# Patient Record
Sex: Female | Born: 2000 | Race: Black or African American | Marital: Single | Smoking: Never smoker
Health system: Southern US, Community
[De-identification: ages and names within clinical notes are randomized; demographics above are authoritative.]

## PROBLEM LIST (undated history)

## (undated) ENCOUNTER — Ambulatory Visit: Source: Home / Self Care

## (undated) HISTORY — PX: NO PAST SURGERIES: SHX2092

---
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
## 2018-05-01 ENCOUNTER — Encounter (HOSPITAL_COMMUNITY): Payer: Self-pay | Admitting: Emergency Medicine

## 2018-05-01 ENCOUNTER — Ambulatory Visit (HOSPITAL_COMMUNITY)
Admission: EM | Admit: 2018-05-01 | Discharge: 2018-05-01 | Disposition: A | Discharge status: Home / Self Care | Payer: Managed Care, Other (non HMO) | Attending: Internal Medicine | Admitting: Internal Medicine

## 2018-05-01 DIAGNOSIS — G43119 Migraine with aura, intractable, without status migrainosus: Secondary | ICD-10-CM

## 2018-05-01 MED ORDER — SUMATRIPTAN SUCCINATE 6 MG/0.5ML ~~LOC~~ SOLN
SUBCUTANEOUS | Status: AC
  Filled 2018-05-01: qty 0.5

## 2018-05-01 MED ORDER — SUMATRIPTAN SUCCINATE 6 MG/0.5ML ~~LOC~~ SOLN
6.0000 mg | Freq: Once | SUBCUTANEOUS | Status: AC
  Administered 2018-05-01: 6 mg via SUBCUTANEOUS

## 2018-05-01 MED ORDER — KETOROLAC TROMETHAMINE 30 MG/ML IJ SOLN
INTRAMUSCULAR | Status: AC
  Filled 2018-05-01: qty 1

## 2018-05-01 MED ORDER — DEXAMETHASONE SODIUM PHOSPHATE 10 MG/ML IJ SOLN
10.0000 mg | Freq: Once | INTRAMUSCULAR | Status: AC
  Administered 2018-05-01: 10 mg via INTRAMUSCULAR

## 2018-05-01 MED ORDER — KETOROLAC TROMETHAMINE 30 MG/ML IJ SOLN
30.0000 mg | Freq: Once | INTRAMUSCULAR | Status: AC
  Administered 2018-05-01: 30 mg via INTRAVENOUS

## 2018-05-01 MED ORDER — DEXAMETHASONE SODIUM PHOSPHATE 10 MG/ML IJ SOLN
INTRAMUSCULAR | Status: AC
  Filled 2018-05-01: qty 1

## 2018-05-01 MED ORDER — SUMATRIPTAN SUCCINATE 25 MG PO TABS: ORAL_TABLET | ORAL | 0 refills | Status: DC

## 2018-05-01 NOTE — ED Provider Notes (Signed)
[facility]    CSN: [id number] Arrival date & time: 05/01/18  1245     History   Chief Complaint Chief Complaint  Patient presents with  . Headache    HPI Corrine Tillis is a 18 y.o. female no past medical history comes to urgent care with complaints of generalized headache of 2 weeks duration.  Headache is severe 10 out of 10.  No relieving factors.  Headache is worsened by bright light.  She complains of noncolored floaters in her visual field.  No nausea or vomiting.  Patient has not been sleeping well.  No numbness or tingling.Marland Kitchen   HPI  History reviewed. No pertinent past medical history.  There are no active problems to display for this patient.   History reviewed. No pertinent surgical history.  OB History   No obstetric history on file.      Home Medications    Prior to Admission medications   Medication Sig Start Date End Date Taking? Authorizing Provider  cyclobenzaprine (FLEXERIL) 10 MG tablet Take 1 tablet (10 mg total) by mouth 2 (two) times daily as needed for muscle spasms. 11/10/15   Shaune Pollack, MD  ibuprofen (ADVIL,MOTRIN) 100 MG tablet Take 100 mg by mouth every 6 (six) hours as needed for fever.    [provider]  naproxen (NAPROSYN) 500 MG tablet Take 1 tablet (500 mg total) by mouth 2 (two) times daily as needed for headache. 11/10/15   Shaune Pollack, MD  SUMAtriptan (IMITREX) 25 MG tablet Please take 1 tablet with headache. May repeat in 2 hours if headache persists or recurs. 05/01/18   LampteyBritta Mccreedy, MD    Family History Family History  Problem Relation Age of Onset  . Healthy Mother   . Healthy Father     Social History Social History   Tobacco Use  . Smoking status: Never Smoker  . Smokeless tobacco: Never Used  Substance Use Topics  . Alcohol use: No  . Drug use: No     Allergies   Other   Review of Systems Review of Systems  Constitutional: Negative for activity change and appetite change.    HENT: Negative for congestion, ear discharge, ear pain, postnasal drip, rhinorrhea and tinnitus.   Eyes: Negative for discharge and itching.  Respiratory: Negative for cough, chest tightness and wheezing.   Gastrointestinal: Negative for abdominal distention and abdominal pain.  Endocrine: Negative for cold intolerance.  Genitourinary: Negative for dysuria, frequency and urgency.  Musculoskeletal: Negative for myalgias, neck pain and neck stiffness.  Skin: Negative for rash and wound.  Neurological: Positive for dizziness and light-headedness. Negative for weakness and numbness.  Psychiatric/Behavioral: Negative for agitation and confusion.     Physical Exam Triage Vital Signs ED Triage Vitals  Enc Vitals Group     BP 05/01/18 1318 129/60     Pulse Rate 05/01/18 1318 91     Resp 05/01/18 1318 18     Temp 05/01/18 1318 (!) 97.5 F (36.4 C)     Temp Source 05/01/18 1318 Temporal     SpO2 05/01/18 1318 100 %     Weight --      Height --      Head Circumference --      Peak Flow --      Pain Score 05/01/18 1319 7     Pain Loc --      Pain Edu? --      Excl. in GC? --    No data  found.  Updated Vital Signs BP 129/60 (BP Location: Right Arm)   Pulse 91   Temp (!) 97.5 F (36.4 C) (Temporal)   Resp 18   SpO2 100%   Visual Acuity Right Eye Distance:   Left Eye Distance:   Bilateral Distance:    Right Eye Near:   Left Eye Near:    Bilateral Near:     Physical Exam Constitutional:      General: She is in acute distress.     Appearance: She is well-developed.  HENT:     Mouth/Throat:     Mouth: Mucous membranes are moist.  Eyes:     General: No visual field deficit.    Pupils: Pupils are equal, round, and reactive to light.  Neck:     Musculoskeletal: Normal range of motion. No neck rigidity.  Cardiovascular:     Rate and Rhythm: Normal rate and regular rhythm.     Heart sounds: Normal heart sounds.  Pulmonary:     Effort: Pulmonary effort is normal.      Breath sounds: Normal breath sounds.  Abdominal:     General: Bowel sounds are normal.     Palpations: Abdomen is soft.  Musculoskeletal: Normal range of motion.  Lymphadenopathy:     Cervical: No cervical adenopathy.  Skin:    General: Skin is warm.     Capillary Refill: Capillary refill takes less than 2 seconds.     Findings: No erythema or rash.  Neurological:     Mental Status: She is alert.     GCS: GCS eye subscore is 4. GCS verbal subscore is 5. GCS motor subscore is 6.     Cranial Nerves: No cranial nerve deficit, dysarthria or facial asymmetry.     Sensory: No sensory deficit.     Motor: No weakness.     Gait: Gait normal.     Deep Tendon Reflexes: Reflexes normal.  Psychiatric:        Mood and Affect: Mood normal.        Behavior: Behavior normal.      UC Treatments / Results  Labs (all labs ordered are listed, but only abnormal results are displayed) Labs Reviewed - No data to display  EKG None  Radiology No results found.  Procedures Procedures (including critical care time)  Medications Ordered in UC Medications  dexamethasone (DECADRON) injection 10 mg (10 mg Intramuscular Given 05/01/18 1436)  SUMAtriptan (IMITREX) injection 6 mg (6 mg Subcutaneous Given 05/01/18 1436)  ketorolac (TORADOL) 30 MG/ML injection 30 mg (30 mg Intravenous Given 05/01/18 1437)    Initial Impression / Assessment and Plan / UC Course  I have reviewed the triage vital signs and the nursing notes.  Pertinent labs & imaging results that were available during my care of the patient were reviewed by me and considered in my medical decision making (see chart for details).     1.  Intractable migraine with aura: Toradol 30 mg IM Imitrex 6 mg subcu Decadron 10 mg IM Patient is prescribed Imitrex 25 mg orally for headache. Final Clinical Impressions(s) / UC Diagnoses   Final diagnoses:  Intractable migraine with aura without status migrainosus   Discharge Instructions   None     ED Prescriptions    Medication Sig Dispense Auth. Provider   SUMAtriptan (IMITREX) 25 MG tablet Please take 1 tablet with headache. May repeat in 2 hours if headache persists or recurs. 10 tablet Danyela Posas, Britta Mccreedy, MD     Controlled Substance  Prescriptions [place] Controlled Substance Registry consulted? No   Merrilee Jansky, MD 05/01/18 1455

## 2018-05-01 NOTE — ED Triage Notes (Signed)
Pt sts HA x 2 weeks and had some dizziness today

## 2019-07-11 ENCOUNTER — Other Ambulatory Visit (HOSPITAL_COMMUNITY)
Admission: RE | Admit: 2019-07-11 | Discharge: 2019-07-11 | Disposition: A | Discharge status: Home / Self Care | Payer: Managed Care, Other (non HMO) | Source: Ambulatory Visit | Attending: Internal Medicine | Admitting: Internal Medicine

## 2019-07-11 ENCOUNTER — Other Ambulatory Visit: Payer: Self-pay | Admitting: Internal Medicine

## 2019-07-11 DIAGNOSIS — Z124 Encounter for screening for malignant neoplasm of cervix: Secondary | ICD-10-CM | POA: Diagnosis not present

## 2019-07-12 LAB — CYTOLOGY - PAP
Adequacy: ABSENT
Comment: NEGATIVE
Diagnosis: NEGATIVE
Trichomonas: NEGATIVE

## 2019-07-22 ENCOUNTER — Other Ambulatory Visit: Payer: Self-pay

## 2019-07-22 ENCOUNTER — Ambulatory Visit (HOSPITAL_COMMUNITY)
Admission: EM | Admit: 2019-07-22 | Discharge: 2019-07-22 | Disposition: A | Discharge status: Home / Self Care | Payer: Managed Care, Other (non HMO) | Attending: Family Medicine | Admitting: Family Medicine

## 2019-07-22 ENCOUNTER — Encounter (HOSPITAL_COMMUNITY): Payer: Self-pay

## 2019-07-22 DIAGNOSIS — R1084 Generalized abdominal pain: Secondary | ICD-10-CM | POA: Diagnosis present

## 2019-07-22 DIAGNOSIS — R112 Nausea with vomiting, unspecified: Secondary | ICD-10-CM | POA: Insufficient documentation

## 2019-07-22 DIAGNOSIS — R197 Diarrhea, unspecified: Secondary | ICD-10-CM | POA: Diagnosis not present

## 2019-07-22 DIAGNOSIS — N3000 Acute cystitis without hematuria: Secondary | ICD-10-CM | POA: Diagnosis present

## 2019-07-22 LAB — POCT URINALYSIS DIP (DEVICE)
Bilirubin Urine: NEGATIVE
Glucose, UA: NEGATIVE mg/dL
Hgb urine dipstick: NEGATIVE
Ketones, ur: NEGATIVE mg/dL
Leukocytes,Ua: NEGATIVE
Nitrite: NEGATIVE
Protein, ur: NEGATIVE mg/dL
Specific Gravity, Urine: 1.02 (ref 1.005–1.030)
Urobilinogen, UA: 0.2 mg/dL (ref 0.0–1.0)
pH: 8.5 — ABNORMAL HIGH (ref 5.0–8.0)

## 2019-07-22 LAB — POC URINE PREG, ED: Preg Test, Ur: NEGATIVE

## 2019-07-22 MED ORDER — ONDANSETRON HCL 4 MG PO TABS: 4.0000 mg | ORAL_TABLET | Freq: Four times a day (QID) | ORAL | 0 refills | Status: DC

## 2019-07-22 MED ORDER — NITROFURANTOIN MONOHYD MACRO 100 MG PO CAPS: 100.0000 mg | ORAL_CAPSULE | Freq: Two times a day (BID) | ORAL | 0 refills | Status: DC

## 2019-07-22 NOTE — ED Triage Notes (Signed)
Patient reports she went out last night with her family. Woke up this morning at 530am with abdominal pain, n/v/d. Reports she thinks it could be food poisoning or the alcohol from last night. Denies URI symptoms.

## 2019-07-22 NOTE — ED Provider Notes (Signed)
[facility]   [id number] 07/22/19 Arrival Time: 1147  CC: ABDOMINAL PAIN  SUBJECTIVE:  Sylvia Daniel is a 19 y.o. female who presents with complaint of abdominal discomfort that began abruptly last night. Reports that she had dinner at a restaurant last night and that she had 3 alcoholic drinks last night. Describes as intermittent and cramping in character. Reports that she also currently has BV and has not taken the medication for it yet. Has not tried OTC medications. Denies alleviating or aggravating factors. Denies similar symptoms in the past.  Last BM yesterday.  Denies fever, chills, appetite changes, weight changes, chest pain, SOB, diarrhea, constipation, hematochezia, melena, dysuria, difficulty urinating, increased frequency or urgency, flank pain, loss of bowel or bladder function, vaginal discharge, vaginal odor, vaginal bleeding, dyspareunia, pelvic pain.     Patient's last menstrual period was 07/14/2019 (exact date).  ROS: As per HPI.  All other pertinent ROS negative.     History reviewed. No pertinent past medical history. History reviewed. No pertinent surgical history. Allergies  Allergen Reactions  . Other     A particular type of soap at work   No current facility-administered medications on file prior to encounter.   Current Outpatient Medications on File Prior to Encounter  Medication Sig Dispense Refill  . cyclobenzaprine (FLEXERIL) 10 MG tablet Take 1 tablet (10 mg total) by mouth 2 (two) times daily as needed for muscle spasms. 20 tablet 0  . ibuprofen (ADVIL,MOTRIN) 100 MG tablet Take 100 mg by mouth every 6 (six) hours as needed for fever.    . naproxen (NAPROSYN) 500 MG tablet Take 1 tablet (500 mg total) by mouth 2 (two) times daily as needed for headache. 30 tablet 0  . SUMAtriptan (IMITREX) 25 MG tablet Please take 1 tablet with headache. May repeat in 2 hours if headache persists or recurs. 10 tablet 0   Social History   Socioeconomic  History  . Marital status: Single    Spouse name: Not on file  . Number of children: Not on file  . Years of education: Not on file  . Highest education level: Not on file  Occupational History  . Not on file  Tobacco Use  . Smoking status: Never Smoker  . Smokeless tobacco: Never Used  Substance and Sexual Activity  . Alcohol use: No  . Drug use: No  . Sexual activity: Yes    Birth control/protection: None  Other Topics Concern  . Not on file  Social History Narrative  . Not on file   Social Determinants of Health   Financial Resource Strain:   . Difficulty of Paying Living Expenses:   Food Insecurity:   . Worried About Programme researcher, broadcasting/film/video in the Last Year:   . Barista in the Last Year:   Transportation Needs:   . Freight forwarder (Medical):   Marland Kitchen Lack of Transportation (Non-Medical):   Physical Activity:   . Days of Exercise per Week:   . Minutes of Exercise per Session:   Stress:   . Feeling of Stress :   Social Connections:   . Frequency of Communication with Friends and Family:   . Frequency of Social Gatherings with Friends and Family:   . Attends Religious Services:   . Active Member of Clubs or Organizations:   . Attends Banker Meetings:   Marland Kitchen Marital Status:   Intimate Partner Violence:   . Fear of Current or Ex-Partner:   . Emotionally  Abused:   Marland Kitchen Physically Abused:   . Sexually Abused:    Family History  Problem Relation Age of Onset  . Healthy Mother   . Healthy Father      OBJECTIVE:  Vitals:   07/22/19 1256  BP: 137/65  Pulse: 78  Resp: 16  Temp: 98.3 F (36.8 C)  TempSrc: Oral  SpO2: 99%    General appearance: Alert; NAD HEENT: NCAT.  Oropharynx clear.  Lungs: clear to auscultation bilaterally without adventitious breath sounds Heart: regular rate and rhythm.  Radial pulses 2+ symmetrical bilaterally Abdomen: soft, non-distended; normal active bowel sounds; non-tender to light and deep palpation; nontender  at McBurney's point; negative Murphy's sign; negative rebound; no guarding Back: no CVA tenderness Extremities: no edema; symmetrical with no gross deformities Skin: warm and dry Neurologic: normal gait Psychological: alert and cooperative; normal mood and affect  LABS: Results for orders placed or performed during the hospital encounter of 07/22/19 (from the past 24 hour(s))  POCT urinalysis dip (device)     Status: Abnormal   Collection Time: 07/22/19  1:07 PM  Result Value Ref Range   Glucose, UA NEGATIVE NEGATIVE mg/dL   Bilirubin Urine NEGATIVE NEGATIVE   Ketones, ur NEGATIVE NEGATIVE mg/dL   Specific Gravity, Urine 1.020 1.005 - 1.030   Hgb urine dipstick NEGATIVE NEGATIVE   pH 8.5 (H) 5.0 - 8.0   Protein, ur NEGATIVE NEGATIVE mg/dL   Urobilinogen, UA 0.2 0.0 - 1.0 mg/dL   Nitrite NEGATIVE NEGATIVE   Leukocytes,Ua NEGATIVE NEGATIVE  POC urine pregnancy     Status: None   Collection Time: 07/22/19  1:15 PM  Result Value Ref Range   Preg Test, Ur NEGATIVE NEGATIVE    DIAGNOSTIC STUDIES: No results found.   ASSESSMENT & PLAN:  1. Acute cystitis without hematuria   2. Generalized abdominal pain   3. Nausea vomiting and diarrhea     Meds ordered this encounter  Medications  . nitrofurantoin, macrocrystal-monohydrate, (MACROBID) 100 MG capsule    Sig: Take 1 capsule (100 mg total) by mouth 2 (two) times daily.    Dispense:  10 capsule    Refill:  0    Order Specific Question:   Supervising Provider    Answer:   Merrilee Jansky [id number]  . ondansetron (ZOFRAN) 4 MG tablet    Sig: Take 1 tablet (4 mg total) by mouth every 6 (six) hours.    Dispense:  12 tablet    Refill:  0    Order Specific Question:   Supervising Provider    Answer:   Merrilee Jansky [[id number]]    Macrobid prescribed UC culture ordered Get rest and drink fluids Zofran prescribed.  Take as directed.    DIET Instructions:  30 minutes after taking nausea medicine, begin with sips of clear  liquids. If able to hold down 2 - 4 ounces for 30 minutes, begin drinking more. Increase your fluid intake to replace losses. Clear liquids only for 24 hours (water, tea, sport drinks, clear flat ginger ale or cola and juices, broth, jello, popsicles, ect). Advance to bland foods, applesauce, rice, baked or boiled chicken, ect. Avoid milk, greasy foods and anything that doesn't agree with you.  If you experience new or worsening symptoms return or go to ER such as fever, chills, nausea, vomiting, diarrhea, bloody or dark tarry stools, constipation, urinary symptoms, worsening abdominal discomfort, symptoms that do not improve with medications, inability to keep fluids down.  Reviewed expectations re: course of  current medical issues. Questions answered. Outlined signs and symptoms indicating need for more acute intervention. Patient verbalized understanding. After Visit Summary given.    Faustino Congress, NP 07/22/19 1347

## 2019-07-22 NOTE — Discharge Instructions (Addendum)
You may have a urinary tract infection. We are going to culture your urine and will call you as soon as we have the results.   I have sent in Macrobid for you to take  I have also sent in Zofran for you to take for nausea as needed.  Drink plenty of water, 8-10 glasses per day.   You may take AZO over the counter for painful urination.  Follow up with your primary care provider as needed.   Go to the Emergency Department if you experience severe pain, shortness of breath, high fever, or other concerns.

## 2019-07-23 LAB — URINE CULTURE: Culture: <10000 — AB

## 2020-01-01 ENCOUNTER — Other Ambulatory Visit: Payer: Self-pay

## 2020-01-01 ENCOUNTER — Encounter: Payer: Self-pay | Admitting: Registered"

## 2020-01-01 ENCOUNTER — Encounter: Payer: Managed Care, Other (non HMO) | Attending: Internal Medicine | Admitting: Registered"

## 2020-01-01 DIAGNOSIS — E669 Obesity, unspecified: Secondary | ICD-10-CM | POA: Diagnosis not present

## 2020-01-01 NOTE — Progress Notes (Signed)
  Medical Nutrition Therapy:  Appt start time: 8:35 end time:  9:30.   Assessment:  Primary concerns today: Pt states her PCP wanted her to come for guidance to lose weight. States she is on the verge of having diabetes and wants her to lose weight to prevent being diagnosed with diabetes. Pt states she is unaware of glucose labs/A1c. States she was told her BMI is too high for her age. Pt states her weight fluctuates. States she wants to lose weight because she is not happy. States she doesn't feel confident in her body. States she doesn't have energy. Feels bloated all the time. States she doesn't eat 3 meals a day; eats 1-2 times/day. States she will eat all day long when having her menstrual cycle.    Pt states she feels stressed because she is unable to lose weight. States she wants to lose like 50 lbs which could help with self-esteem. Reports she has been this size all of her life. States family and peers have negatively commented on her being fat.   States she will start a diet and then give up. States she eats a lot of greasy food.   Works from home for [employer] center. States she doesn't get enough rest. Sleeps about 5-6 hrs/night. States sometimes its more and sometimes less.   Pt expectations: how to lose weight   Preferred Learning Style:   No preference indicated   Learning Readiness:   Not ready  Contemplating  Ready  Change in progress   MEDICATIONS: See list   DIETARY INTAKE:  Usual eating pattern includes 1-2 meals and 0-1 snacks per day.  Everyday foods include fried foods, corn, broccoli/cheese, granola bar, cereal.  Avoided foods include none stated.    24-hr recall:  B ( AM): cereal or Gatorade/juice Snk ( AM):   L ( PM):  Snk ( PM):  D (5 PM): 15 pizza rolls or fried chicken + broccoli/cheese + corn Snk ( PM): granola bar Beverages: Gatorade/juice, water  Usual physical activity: none reported  Estimated energy needs: 2000 calories 225 g  carbohydrates 150 g protein 56 g fat  Progress Towards Goal(s):  In progress.   Nutritional Diagnosis:  NB-1.1 Food and nutrition-related knowledge deficit As related to lack of prior nutrition related information.  As evidenced by pt verbalizes incomplete knowledge.    Intervention: Pt was educated and counseled on the benefits of eating throughout the day, metabolism, eating to fuel the body, diet culture, risks for diabetes, health at every size, and importance of physical activity. Pt was in agreement with goal listed. Goals: - Aim to eat every 3-5 hours.  - Aim to balance meals with protein, starch/grain, fruit, vegetables, and dairy.  - Increase physical activity with walking 2-3 days/week for 20-30 minutes.   Teaching Method Utilized:  Visual Auditory Hands on  Handouts given during visit include:  none  Barriers to learning/adherence to lifestyle change: none identified  Demonstrated degree of understanding via:  Teach Back   Monitoring/Evaluation:  Dietary intake, exercise, and body weight in 1 month(s).

## 2020-01-01 NOTE — Patient Instructions (Addendum)
-   Aim to eat every 3-5 hours.   - Aim to balance meals with protein, starch/grain, fruit, vegetables, and dairy.   - Increase physical activity with walking 2-3 days/week for 20-30 minutes.

## 2020-01-29 ENCOUNTER — Other Ambulatory Visit: Payer: Self-pay

## 2020-01-29 ENCOUNTER — Encounter: Payer: Self-pay | Admitting: Registered"

## 2020-01-29 ENCOUNTER — Encounter: Payer: Managed Care, Other (non HMO) | Attending: Internal Medicine | Admitting: Registered"

## 2020-01-29 DIAGNOSIS — E669 Obesity, unspecified: Secondary | ICD-10-CM | POA: Diagnosis not present

## 2020-01-29 NOTE — Progress Notes (Signed)
  Medical Nutrition Therapy:  Appt start time: 9:16 end time:  9:33   Assessment:  Primary concerns today: Pt states her PCP wanted her to come for guidance to lose weight.   Pt arrives with younger cousin. States younger cousin has moved in with her for a few months and plans to start going to the gym and walking with her. States she has started drinking more 2% milk, eating more wheat bread, and reduced intake of fried foods. States she has only had fried food once since previous visit; yesterday for her "cheat" day.Reports eating more often during the day to help nourish body. States she has a gym in her apartment complex and may go there as well for movement.   States she had lab work done on 11/9 but has not received results yet. Plans to contact PCP office for results today.   Previous visit: Works from home for [employer] center. Works Sunday, Monday, Thursday, and Friday. States she doesn't get enough rest. Sleeps about 5-6 hrs/night. States sometimes its more and sometimes less.   Pt expectations: how to lose weight   Preferred Learning Style:   No preference indicated   Learning Readiness:   Not ready  Contemplating  Ready  Change in progress   MEDICATIONS: See list   DIETARY INTAKE:  Usual eating pattern includes 2-3 meals and 0-1 snacks per day.  Everyday foods include fried foods, corn, broccoli/cheese, granola bar, cereal.  Avoided foods include none stated.    24-hr recall:  B ( AM): eggs + sausage + OJ Snk ( AM):   L ( PM): beefaroni + water Snk ( PM): banana pudding D (5 PM): fried chicken + greens + corn on cob + OJ Snk ( PM): banana Beverages: water (2*16 oz; 16 oz; 32 oz), OJ (2*12 oz; 24 oz); 56 oz total   Usual physical activity: none reported  Estimated energy needs: 2000 calories 225 g carbohydrates 150 g protein 56 g fat  Progress Towards Goal(s):  In progress.   Nutritional Diagnosis:  NB-1.1 Food and nutrition-related knowledge  deficit As related to lack of prior nutrition related information.  As evidenced by pt verbalizes incomplete knowledge.    Intervention: Pt was encouraged with changes made thus far. Discussed role of physical activity and overall health benefits. Discussed increasing water intake. Pt was in agreement with goal listed. Goals: - Keep up the great work eating 3 meals a day! - Increase water intake to 3 bottles a day.  - Have with lunch, dinner, and another time during the  day.   - Goal is at least 48 oz day.  - Increase physical activity with walking 2-3 days/week for 20-30 minutes.   Teaching Method Utilized:  Visual Auditory Hands on  Handouts given during visit include:  none  Barriers to learning/adherence to lifestyle change: none identified  Demonstrated degree of understanding via:  Teach Back   Monitoring/Evaluation:  Dietary intake, exercise, and body weight in 1 month(s).

## 2020-01-29 NOTE — Patient Instructions (Signed)
-   Keep up the great work eating 3 meals a day!  - Increase water intake to 3 bottles a day.  - Have with lunch, dinner, and another time during the day.   - Goal is at least 48 oz day.   - Increase physical activity with walking 2-3 days/week for 20-30 minutes.

## 2020-02-27 ENCOUNTER — Ambulatory Visit: Payer: Managed Care, Other (non HMO) | Admitting: Registered"

## 2020-03-12 ENCOUNTER — Ambulatory Visit: Payer: Managed Care, Other (non HMO) | Admitting: Registered"

## 2020-04-01 ENCOUNTER — Ambulatory Visit: Payer: Managed Care, Other (non HMO) | Admitting: Registered"

## 2020-07-10 ENCOUNTER — Other Ambulatory Visit: Payer: Self-pay

## 2020-07-10 ENCOUNTER — Encounter: Payer: Self-pay | Admitting: General Practice

## 2020-07-10 ENCOUNTER — Ambulatory Visit (INDEPENDENT_AMBULATORY_CARE_PROVIDER_SITE_OTHER): Payer: Managed Care, Other (non HMO) | Admitting: Obstetrics & Gynecology

## 2020-07-10 ENCOUNTER — Encounter: Payer: Self-pay | Admitting: Obstetrics & Gynecology

## 2020-07-10 VITALS — BP 127/68 | HR 98 | Ht 65.0 in | Wt 253.0 lb

## 2020-07-10 DIAGNOSIS — Z01419 Encounter for gynecological examination (general) (routine) without abnormal findings: Secondary | ICD-10-CM

## 2020-07-10 DIAGNOSIS — Z113 Encounter for screening for infections with a predominantly sexual mode of transmission: Secondary | ICD-10-CM | POA: Diagnosis not present

## 2020-07-10 DIAGNOSIS — N898 Other specified noninflammatory disorders of vagina: Secondary | ICD-10-CM

## 2020-07-10 NOTE — Progress Notes (Signed)
Subjective:     Sylvia Daniel is a 20 y.o. female here for a routine exam.  Current complaints: vaginal discharge with odor. Pt was referred here by her mother who is a pt. She reports monthly cycles that last 5 days. She is sexually active with no contraception. She does not want contraception. She reports that she is not actively trying to conceive but, would be ok if she gets pregnant. She works on [employer]. 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       Gynecologic History Patient's last menstrual period was 06/29/2020 (exact date). Contraception: none Last Pap: 07/11/2019. Results were: normal Last mammogram: n/a.  Obstetric History OB History  Gravida Para Term Preterm AB Living  0 0 0 0 0 0  SAB IAB Ectopic Multiple Live Births  0 0 0 0 0   The following portions of the patient's history were reviewed and updated as appropriate: allergies, current medications, past family history, past medical history, past social history, past surgical history and problem list.  Review of Systems Pertinent items are noted in HPI.    Objective:  BP 127/68 (BP Location: Right Arm, Patient Position: Sitting, Cuff Size: Large)   Pulse 98   Ht 5\' 5"  (1.651 m)   Wt 253 lb (114.8 kg)   LMP 06/29/2020 (Exact Date)   BMI 42.10 kg/m  General Appearance:    Alert, cooperative, no distress, appears stated age  Head:    Normocephalic, without obvious abnormality, atraumatic  Eyes:    conjunctiva/corneas clear, EOM's intact, both eyes  Ears:    Normal external ear canals, both ears  Nose:   Nares normal, septum midline, mucosa normal, no drainage    or sinus tenderness  Throat:   Lips, mucosa, and tongue normal; teeth and gums normal  Neck:   Supple, symmetrical, trachea midline, no adenopathy;    thyroid:  no enlargement/tenderness/nodules  Back:     Symmetric, no curvature, ROM normal, no CVA tenderness  Lungs:     respirations unlabored  Chest Wall:    No tenderness or deformity    Heart:    Regular rate and rhythm  Breast Exam:    No tenderness, masses, or nipple abnormality  Abdomen:     Soft, non-tender, bowel sounds active all four quadrants,    no masses, no organomegaly. Has umbilical ring.   Genitalia:    Normal female without lesion, discharge or tenderness     Extremities:   Extremities normal, atraumatic, no cyanosis or edema  Pulses:   2+ and symmetric all extremities  Skin:   Skin color, texture, turgor normal, no rashes or lesions; multiple tatoos on ext      Assessment:    Healthy female exam.   Vaginal odor STI screen   Plan:   Sylvia Daniel was seen today for gynecologic exam.  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   Sylvia Daniel L. Harraway-Smith, M.D., 08/29/2020

## 2020-07-10 NOTE — Progress Notes (Signed)
Pt presents today as new patient for yearly exam. LMP: 06/29/20. Pt is currently not on anything for [facility] and does not wish to go on anything at this time. Last pap was 07/11/19 and was normal. Pt states she is having vaginal odor that she is concerned about. Pt has not other complaints at this time.

## 2020-07-11 LAB — CERVICOVAGINAL ANCILLARY ONLY
Bacterial Vaginitis (gardnerella): NEGATIVE
Candida Glabrata: NEGATIVE
Candida Vaginitis: NEGATIVE
Chlamydia: NEGATIVE
Comment: NEGATIVE
Comment: NEGATIVE
Comment: NEGATIVE
Comment: NEGATIVE
Comment: NEGATIVE
Comment: NORMAL
Neisseria Gonorrhea: NEGATIVE
Trichomonas: NEGATIVE

## 2021-06-19 ENCOUNTER — Other Ambulatory Visit: Payer: Self-pay

## 2021-06-19 ENCOUNTER — Encounter (HOSPITAL_COMMUNITY): Payer: Self-pay | Admitting: Emergency Medicine

## 2021-06-19 ENCOUNTER — Ambulatory Visit (HOSPITAL_COMMUNITY)
Admission: EM | Admit: 2021-06-19 | Discharge: 2021-06-19 | Disposition: A | Discharge status: Home / Self Care | Payer: Managed Care, Other (non HMO) | Attending: Family Medicine | Admitting: Family Medicine

## 2021-06-19 DIAGNOSIS — M62830 Muscle spasm of back: Secondary | ICD-10-CM | POA: Diagnosis not present

## 2021-06-19 MED ORDER — TIZANIDINE HCL 4 MG PO TABS: 4.0000 mg | ORAL_TABLET | Freq: Four times a day (QID) | ORAL | 0 refills | Status: DC | PRN

## 2021-06-19 NOTE — Discharge Instructions (Signed)
You were seen today for back pain due to muscle spasms.  ?I have sent out a muscle relaxer for you to take.  This could make you tired/sleepy so please take when home and not driving.   ?You should also take motrin/ibuprofen 600mg  every 6hrs with food to avoid upset stomach.  ?A heating pad would also be helpful.  ?Please return if not improving or worsening.  ?

## 2021-06-19 NOTE — ED Provider Notes (Signed)
?Emma ? ? ? ?CSN: [id number] ?Arrival date & time: 06/19/21  1303 ? ? ?  ? ?History   ?Chief Complaint ?Chief Complaint  ?Patient presents with  ? Back Spasm  ? ? ?HPI ?Sylvia Daniel is a 21 y.o. female.  ? ?Patient is here for back spasms that started yesterday.  At the low mid back and on the left.  ?She does heavy lifting at work.  ?She did try motrin without much help.  ?Her legs are sore, but no pain/numbness or weakness into the leg.  ? ?History reviewed. No pertinent past medical history. ? ?There are no problems to display for this patient. ? ? ?Past Surgical History:  ?Procedure Laterality Date  ? NO PAST SURGERIES    ? ? ?OB History   ? ? Gravida  ?0  ? Para  ?0  ? Term  ?0  ? Preterm  ?0  ? AB  ?0  ? Living  ?0  ?  ? ? SAB  ?0  ? IAB  ?0  ? Ectopic  ?0  ? Multiple  ?0  ? Live Births  ?0  ?   ?  ?  ? ? ? ?Home Medications   ? ?Prior to Admission medications   ?Not on File  ? ? ?Family History ?Family History  ?Problem Relation Age of Onset  ? Healthy Mother   ? Healthy Father   ? Diabetes Other   ? Hypertension Other   ? ? ?Social History ?Social History  ? ?Tobacco Use  ? Smoking status: Never  ? Smokeless tobacco: Never  ?Substance Use Topics  ? Alcohol use: No  ? Drug use: No  ? ? ? ?Allergies   ?Other ? ? ?Review of Systems ?Review of Systems  ?Constitutional: Negative.   ?HENT: Negative.    ?Respiratory: Negative.    ?Cardiovascular: Negative.   ?Gastrointestinal: Negative.   ?Genitourinary: Negative.   ?Musculoskeletal:  Positive for back pain.  ?Skin: Negative.   ? ? ?Physical Exam ?Triage Vital Signs ?ED Triage Vitals  ?Enc Vitals Group  ?   BP 06/19/21 1425 112/75  ?   Pulse Rate 06/19/21 1425 60  ?   Resp 06/19/21 1425 16  ?   Temp 06/19/21 1425 98.2 ?F (36.8 ?C)  ?   Temp Source 06/19/21 1425 Oral  ?   SpO2 06/19/21 1425 100 %  ?   Weight 06/19/21 1424 253 lb 1.4 oz (114.8 kg)  ?   Height 06/19/21 1424 5\' 5"  (1.651 m)  ?   Head Circumference --   ?   Peak Flow --   ?   Pain  Score 06/19/21 1423 10  ?   Pain Loc --   ?   Pain Edu? --   ?   Excl. in [place]? --   ? ?No data found. ? ?Updated Vital Signs ?BP 112/75 (BP Location: Left Arm)   Pulse 60   Temp 98.2 ?F (36.8 ?C) (Oral)   Resp 16   Ht 5\' 5"  (1.651 m)   Wt 114.8 kg   SpO2 100%   BMI 42.12 kg/m?  ? ?Visual Acuity ?Right Eye Distance:   ?Left Eye Distance:   ?Bilateral Distance:   ? ?Right Eye Near:   ?Left Eye Near:    ?Bilateral Near:    ? ?Physical Exam ?Constitutional:   ?   Appearance: Normal appearance.  ?Musculoskeletal:  ?   Comments: TTP at the lumbar spine;  TTP to  the left lower paraspinals;  ?Full ROM at the LE;  pain with resisted left quad testing  ?Skin: ?   General: Skin is warm.  ?Neurological:  ?   General: No focal deficit present.  ?   Mental Status: She is alert.  ?Psychiatric:     ?   Mood and Affect: Mood normal.  ? ? ? ?UC Treatments / Results  ?Labs ?(all labs ordered are listed, but only abnormal results are displayed) ?Labs Reviewed - No data to display ? ?EKG ? ? ?Radiology ?No results found. ? ?Procedures ?Procedures (including critical care time) ? ?Medications Ordered in UC ?Medications - No data to display ? ?Initial Impression / Assessment and Plan / UC Course  ?I have reviewed the triage vital signs and the nursing notes. ? ?Pertinent labs & imaging results that were available during my care of the patient were reviewed by me and considered in my medical decision making (see chart for details). ? ? ?Final Clinical Impressions(s) / UC Diagnoses  ? ?Final diagnoses:  ?Back spasm  ? ? ? ?Discharge Instructions   ? ?  ?You were seen today for back pain due to muscle spasms.  ?I have sent out a muscle relaxer for you to take.  This could make you tired/sleepy so please take when home and not driving.   ?You should also take motrin/ibuprofen 600mg  every 6hrs with food to avoid upset stomach.  ?A heating pad would also be helpful.  ?Please return if not improving or worsening.  ? ? ? ?ED Prescriptions    ? ? Medication Sig Dispense Auth. Provider  ? tiZANidine (ZANAFLEX) 4 MG tablet Take 1 tablet (4 mg total) by mouth every 6 (six) hours as needed for muscle spasms. 30 tablet Rondel Oh, MD  ? ?  ? ?PDMP not reviewed this encounter. ?  ?Rondel Oh, MD ?06/19/21 1440 ? ?

## 2021-06-19 NOTE — ED Triage Notes (Signed)
Pt reports back spasms since yesterday.  ?

## 2021-08-11 ENCOUNTER — Ambulatory Visit (INDEPENDENT_AMBULATORY_CARE_PROVIDER_SITE_OTHER): Payer: Managed Care, Other (non HMO)

## 2021-08-11 ENCOUNTER — Ambulatory Visit
Admission: EM | Admit: 2021-08-11 | Discharge: 2021-08-11 | Disposition: A | Discharge status: Home / Self Care | Payer: Managed Care, Other (non HMO) | Attending: Physician Assistant | Admitting: Physician Assistant

## 2021-08-11 ENCOUNTER — Encounter: Payer: Self-pay | Admitting: Emergency Medicine

## 2021-08-11 DIAGNOSIS — M25562 Pain in left knee: Secondary | ICD-10-CM | POA: Diagnosis not present

## 2021-08-11 DIAGNOSIS — M79645 Pain in left finger(s): Secondary | ICD-10-CM

## 2021-08-11 MED ORDER — NAPROXEN 500 MG PO TABS: 500.0000 mg | ORAL_TABLET | Freq: Two times a day (BID) | ORAL | 0 refills | Status: DC

## 2021-08-11 NOTE — ED Provider Notes (Signed)
[facility]    CSN: [id number] Arrival date & time: 08/11/21  1253      History   Chief Complaint Chief Complaint  Patient presents with   Knee Pain   Hand Pain    HPI Sylvia Daniel is a 21 y.o. female.   Patient presents today with several complaints.  Her primary concern today is a 5-day history of medial left knee pain.  She denies any known injury or increase in activity prior to symptom onset.  She reports pain is rated 8 at rest and increases to 9 with ambulation, localized to medial left knee with periodic popping/clicking, no alleviating factors identified.  She has tried ibuprofen without improvement of symptoms.  Denies previous injury or surgery involving her knee.  She is able to ambulate unassisted but finds that this is painful.  She denies any recent fall or significant instability.  In addition, patient reports a month-long history of pain/swelling at her left middle finger MCP joint.  She reports pain is rated 7 on a 0-10 pain scale, described as aching, no aggravating leaving factors identified.  Denies injury or change in activity prior to symptom onset.  She is right-handed but uses her left hand at work.  She does report occasional numbness particularly in this finger but denies any paresthesias.  She has tried 1 dose of ibuprofen which provided minimal relief of symptoms.  She is able to perform daily activities despite symptoms.    History reviewed. No pertinent past medical history.  There are no problems to display for this patient.   Past Surgical History:  Procedure Laterality Date   NO PAST SURGERIES      OB History     Gravida  0   Para  0   Term  0   Preterm  0   AB  0   Living  0      SAB  0   IAB  0   Ectopic  0   Multiple  0   Live Births  0            Home Medications    Prior to Admission medications   Medication Sig Start Date End Date Taking? Authorizing Provider  naproxen (NAPROSYN) 500 MG tablet  Take 1 tablet (500 mg total) by mouth 2 (two) times daily. 08/11/21  Yes Amedio Bowlby, Noberto Retort, PA-C    Family History Family History  Problem Relation Age of Onset   Healthy Mother    Healthy Father    Diabetes Other    Hypertension Other     Social History Social History   Tobacco Use   Smoking status: Never   Smokeless tobacco: Never  Substance Use Topics   Alcohol use: No   Drug use: No     Allergies   Other   Review of Systems Review of Systems  Constitutional:  Positive for activity change. Negative for appetite change, fatigue and fever.  Musculoskeletal:  Positive for arthralgias, gait problem and joint swelling. Negative for myalgias.  Skin:  Negative for color change and wound.  Neurological:  Negative for dizziness, weakness, light-headedness, numbness and headaches.     Physical Exam Triage Vital Signs ED Triage Vitals  Enc Vitals Group     BP 08/11/21 1305 114/76     Pulse Rate 08/11/21 1305 84     Resp 08/11/21 1305 20     Temp 08/11/21 1305 98.2 F (36.8 C)     Temp src --  SpO2 08/11/21 1305 98 %     Weight --      Height --      Head Circumference --      Peak Flow --      Pain Score 08/11/21 1307 7     Pain Loc --      Pain Edu? --      Excl. in [place]? --    No data found.  Updated Vital Signs BP 114/76   Pulse 84   Temp 98.2 F (36.8 C)   Resp 20   SpO2 98%   Visual Acuity Right Eye Distance:   Left Eye Distance:   Bilateral Distance:    Right Eye Near:   Left Eye Near:    Bilateral Near:     Physical Exam Vitals reviewed.  Constitutional:      General: She is awake. She is not in acute distress.    Appearance: Normal appearance. She is well-developed. She is not ill-appearing.     Comments: Very pleasant female appears stated age in no acute distress sitting comfortably in exam room  HENT:     Head: Normocephalic and atraumatic.  Cardiovascular:     Rate and Rhythm: Normal rate and regular rhythm.     Heart sounds:  Normal heart sounds, S1 normal and S2 normal. No murmur heard. Pulmonary:     Effort: Pulmonary effort is normal.     Breath sounds: Normal breath sounds. No wheezing, rhonchi or rales.     Comments: Clear to auscultation bilaterally Musculoskeletal:     Left hand: Tenderness and bony tenderness present. No swelling. Normal range of motion. Normal strength. There is no disruption of two-point discrimination. Normal capillary refill.     Left knee: Bony tenderness present. No swelling. Decreased range of motion. Tenderness present over the medial joint line. No LCL laxity, MCL laxity, ACL laxity or PCL laxity.    Comments: Left hand: Tenderness palpation over left middle MCP joint.  Normal active range of motion with flexion and extension.  Hand neurovascularly intact.  Left knee: Tenderness palpation over medial left knee.  No ligamentous laxity on exam.  No deformity noted.  Strength 5/5 bilateral lower extremities.  Psychiatric:        Behavior: Behavior is cooperative.      UC Treatments / Results  Labs (all labs ordered are listed, but only abnormal results are displayed) Labs Reviewed - No data to display  EKG   Radiology DG Knee 1-2 Views Left  Result Date: 08/11/2021 CLINICAL DATA:  Left knee pain without injury. EXAM: LEFT KNEE - 1-2 VIEW COMPARISON:  None Available. FINDINGS: No evidence of fracture, dislocation, or joint effusion. No evidence of arthropathy or other focal bone abnormality. Soft tissues are unremarkable. IMPRESSION: Negative. Electronically Signed   By: Lupita Raider M.D.   On: 08/11/2021 13:31   DG Finger Middle Left  Result Date: 08/11/2021 CLINICAL DATA:  Left middle finger pain without known injury. EXAM: LEFT MIDDLE FINGER 2+V COMPARISON:  None Available. FINDINGS: There is no evidence of fracture or dislocation. There is no evidence of arthropathy or other focal bone abnormality. Soft tissues are unremarkable. IMPRESSION: Negative. Electronically  Signed   By: Lupita Raider M.D.   On: 08/11/2021 13:30    Procedures Procedures (including critical care time)  Medications Ordered in UC Medications - No data to display  Initial Impression / Assessment and Plan / UC Course  I have reviewed the triage vital signs  and the nursing notes.  Pertinent labs & imaging results that were available during my care of the patient were reviewed by me and considered in my medical decision making (see chart for details).     X-ray obtained of knee that showed no osseous abnormality.  Discussed concern for meniscal injury given clinical presentation.  Recommended conservative treatment measures including RICE protocol.  Patient was placed in brace for comfort and support.  Will use Naprosyn for pain relief with instruction not to take additional NSAIDs to risk of GI bleeding.  She can use Tylenol for breakthrough pain.  Discussed she should follow-up with orthopedic provider and was given contact information for local provider with instruction to call to schedule an appointment.  Discussed alarm symptoms that warrant emergent evaluation.  Strict return precautions given to which she expressed understanding.  Work excuse note provided.  X-ray of finger was normal.  Discussed symptoms are likely related to overuse.  Recommended conservative treatment measures including Naprosyn as well as decrease activity.  Discussed that she should not take NSAIDs with Naprosyn due to risk of GI bleeding.  If her symptoms persist she is to follow-up with hand specialist at orthopedics and was given contact information for local provider.  Discussed alarm symptoms that warrant emergent evaluation.  Strict return precautions given.  Work excuse note provided.  Final Clinical Impressions(s) / UC Diagnoses   Final diagnoses:  Acute pain of left knee  Finger pain, left     Discharge Instructions      Your x-rays were normal.  I am concerned about a meniscal injury in  your knee.  Please use the brace for comfort and support.  Keep it elevated and use ice as needed.  Try to avoid strenuous activity including walking for long periods of time.  Use Naprosyn for pain.  This should help with both the finger and the knee.  Do not take NSAIDs including aspirin, ibuprofen/Advil, naproxen/Aleve.  You can use Tylenol for breakthrough pain.  Follow-up with orthopedics as we discussed; call to schedule appointment.  If you have any worsening symptoms you need to return for reevaluation.     ED Prescriptions     Medication Sig Dispense Auth. Provider   naproxen (NAPROSYN) 500 MG tablet Take 1 tablet (500 mg total) by mouth 2 (two) times daily. 20 tablet Latishia Suitt, Noberto Retort, PA-C      PDMP not reviewed this encounter.   Jeani Hawking, PA-C 08/11/21 1339

## 2021-08-11 NOTE — ED Triage Notes (Addendum)
Pt here with left medial knee pain x 5 days and left middle finger pain x 1 month. Pt states "it feels like my finger is detached". No mechanism of injury.

## 2021-08-11 NOTE — Discharge Instructions (Signed)
Your x-rays were normal.  I am concerned about a meniscal injury in your knee.  Please use the brace for comfort and support.  Keep it elevated and use ice as needed.  Try to avoid strenuous activity including walking for long periods of time.  Use Naprosyn for pain.  This should help with both the finger and the knee.  Do not take NSAIDs including aspirin, ibuprofen/Advil, naproxen/Aleve.  You can use Tylenol for breakthrough pain.  Follow-up with orthopedics as we discussed; call to schedule appointment.  If you have any worsening symptoms you need to return for reevaluation.

## 2021-10-28 ENCOUNTER — Ambulatory Visit: Payer: Managed Care, Other (non HMO) | Admitting: Obstetrics & Gynecology

## 2022-02-03 ENCOUNTER — Encounter: Payer: Self-pay | Admitting: Obstetrics & Gynecology

## 2022-02-03 ENCOUNTER — Ambulatory Visit (INDEPENDENT_AMBULATORY_CARE_PROVIDER_SITE_OTHER): Payer: Managed Care, Other (non HMO) | Admitting: Obstetrics & Gynecology

## 2022-02-03 ENCOUNTER — Other Ambulatory Visit (HOSPITAL_COMMUNITY)
Admission: RE | Admit: 2022-02-03 | Discharge: 2022-02-03 | Disposition: A | Discharge status: Home / Self Care | Payer: Managed Care, Other (non HMO) | Source: Ambulatory Visit | Attending: Obstetrics & Gynecology | Admitting: Obstetrics & Gynecology

## 2022-02-03 VITALS — BP 115/53 | HR 73 | Ht 65.0 in | Wt 250.0 lb

## 2022-02-03 DIAGNOSIS — Z01419 Encounter for gynecological examination (general) (routine) without abnormal findings: Secondary | ICD-10-CM | POA: Diagnosis not present

## 2022-02-03 DIAGNOSIS — Z113 Encounter for screening for infections with a predominantly sexual mode of transmission: Secondary | ICD-10-CM | POA: Diagnosis not present

## 2022-02-03 NOTE — Progress Notes (Signed)
Subjective:     Sylvia Daniel is a 21 y.o. female here for a routine exam.  Current complaints: None. Sexually active no contraception . Open to pregnancy this year. Requests STI screening.    Gynecologic History Patient's last menstrual period was 01/14/2022 (exact date). Contraception: none Last Pap: 07/11/2019. Results were: normal Last mammogram: n/a.  Obstetric History OB History  Gravida Para Term Preterm AB Living  0 0 0 0 0 0  SAB IAB Ectopic Multiple Live Births  0 0 0 0 0   The following portions of the patient's history were reviewed and updated as appropriate: allergies, current medications, past family history, past medical history, past social history, past surgical history, and problem list.  Review of Systems Pertinent items are noted in HPI.    Objective:  BP (!) 115/53   Pulse 73   Ht 5\' 5"  (1.651 m)   Wt 250 lb (113.4 kg)   LMP 01/14/2022 (Exact Date)   BMI 41.60 kg/m   General Appearance:    Alert, cooperative, no distress, appears stated age  Head:    Normocephalic, without obvious abnormality, atraumatic  Eyes:    conjunctiva/corneas clear, EOM's intact, both eyes  Ears:    Normal external ear canals, both ears  Nose:   Nares normal, septum midline, mucosa normal, no drainage    or sinus tenderness  Throat:   Lips, mucosa, and tongue normal; teeth and gums normal  Neck:   Supple, symmetrical, trachea midline, no adenopathy;    thyroid:  no enlargement/tenderness/nodules  Back:     Symmetric, no curvature, ROM normal, no CVA tenderness  Lungs:     respirations unlabored  Chest Wall:    No tenderness or deformity   Heart:    Regular rate and rhythm  Breast Exam:    No tenderness, masses, or nipple abnormality  Abdomen:     Soft, non-tender, bowel sounds active all four quadrants,    no masses, no organomegaly  Genitalia:    Normal female without lesion, discharge or tenderness     Extremities:   Extremities normal, atraumatic, no cyanosis or edema   Pulses:   2+ and symmetric all extremities  Skin:   Skin color, texture, turgor normal, no rashes or lesions     Assessment:    Healthy female exam.    Plan:  Diagnoses and all orders for this visit:  Well female exam with routine gynecological exam -     CBC -     TSH -     Hemoglobin A1c -     Comprehensive metabolic panel  Routine screening for STI (sexually transmitted infection) -     HIV antibody (with reflex) -     RPR -     Hepatitis C Antibody -     Hepatitis B Surface AntiGEN -     Cervicovaginal ancillary only( [place])    F/u in 1 year or sooner prn   Stephany Poorman L. Harraway-Smith, M.D., 01/16/2022

## 2022-02-04 LAB — HEMOGLOBIN A1C
Est. average glucose Bld gHb Est-mCnc: 120 mg/dL
Hgb A1c MFr Bld: 5.8 % — ABNORMAL HIGH (ref 4.8–5.6)

## 2022-02-04 LAB — COMPREHENSIVE METABOLIC PANEL
ALT: 13 IU/L (ref 0–32)
AST: 14 IU/L (ref 0–40)
Albumin/Globulin Ratio: 1.7 (ref 1.2–2.2)
Albumin: 4.3 g/dL (ref 4.0–5.0)
Alkaline Phosphatase: 48 IU/L (ref 44–121)
BUN/Creatinine Ratio: 9 (ref 9–23)
BUN: 8 mg/dL (ref 6–20)
Bilirubin Total: 0.2 mg/dL (ref 0.0–1.2)
CO2: 23 mmol/L (ref 20–29)
Calcium: 9.3 mg/dL (ref 8.7–10.2)
Chloride: 105 mmol/L (ref 96–106)
Creatinine, Ser: 0.85 mg/dL (ref 0.57–1.00)
Globulin, Total: 2.5 g/dL (ref 1.5–4.5)
Glucose: 84 mg/dL (ref 70–99)
Potassium: 4 mmol/L (ref 3.5–5.2)
Sodium: 143 mmol/L (ref 134–144)
Total Protein: 6.8 g/dL (ref 6.0–8.5)
eGFR: 99 mL/min/{1.73_m2} (ref >59)

## 2022-02-04 LAB — HEPATITIS B SURFACE ANTIGEN: Hepatitis B Surface Ag: NEGATIVE

## 2022-02-04 LAB — CERVICOVAGINAL ANCILLARY ONLY
Chlamydia: NEGATIVE
Comment: NEGATIVE
Comment: NEGATIVE
Comment: NORMAL
Neisseria Gonorrhea: NEGATIVE
Trichomonas: NEGATIVE

## 2022-02-04 LAB — CBC
Hematocrit: 35.3 % (ref 34.0–46.6)
Hemoglobin: 11.7 g/dL (ref 11.1–15.9)
MCH: 26.2 pg — ABNORMAL LOW (ref 26.6–33.0)
MCHC: 33.1 g/dL (ref 31.5–35.7)
MCV: 79 fL (ref 79–97)
Platelets: 385 10*3/uL (ref 150–450)
RBC: 4.46 x10E6/uL (ref 3.77–5.28)
RDW: 13.1 % (ref 11.7–15.4)
WBC: 4 10*3/uL (ref 3.4–10.8)

## 2022-02-04 LAB — TSH: TSH: 1.99 u[IU]/mL (ref 0.450–4.500)

## 2022-02-04 LAB — HEPATITIS C ANTIBODY: Hep C Virus Ab: NONREACTIVE

## 2022-02-04 LAB — HIV ANTIBODY (ROUTINE TESTING W REFLEX): HIV Screen 4th Generation wRfx: NONREACTIVE

## 2022-02-04 LAB — RPR: RPR Ser Ql: NONREACTIVE

## 2022-12-04 IMAGING — DX DG KNEE 1-2V*L*
2 series · 2 of 2 positions shown · non-contrast
Comparison: None Available.

CLINICAL DATA: Left knee pain without injury.

EXAM:
LEFT KNEE - 1-2 VIEW

[knee ap]
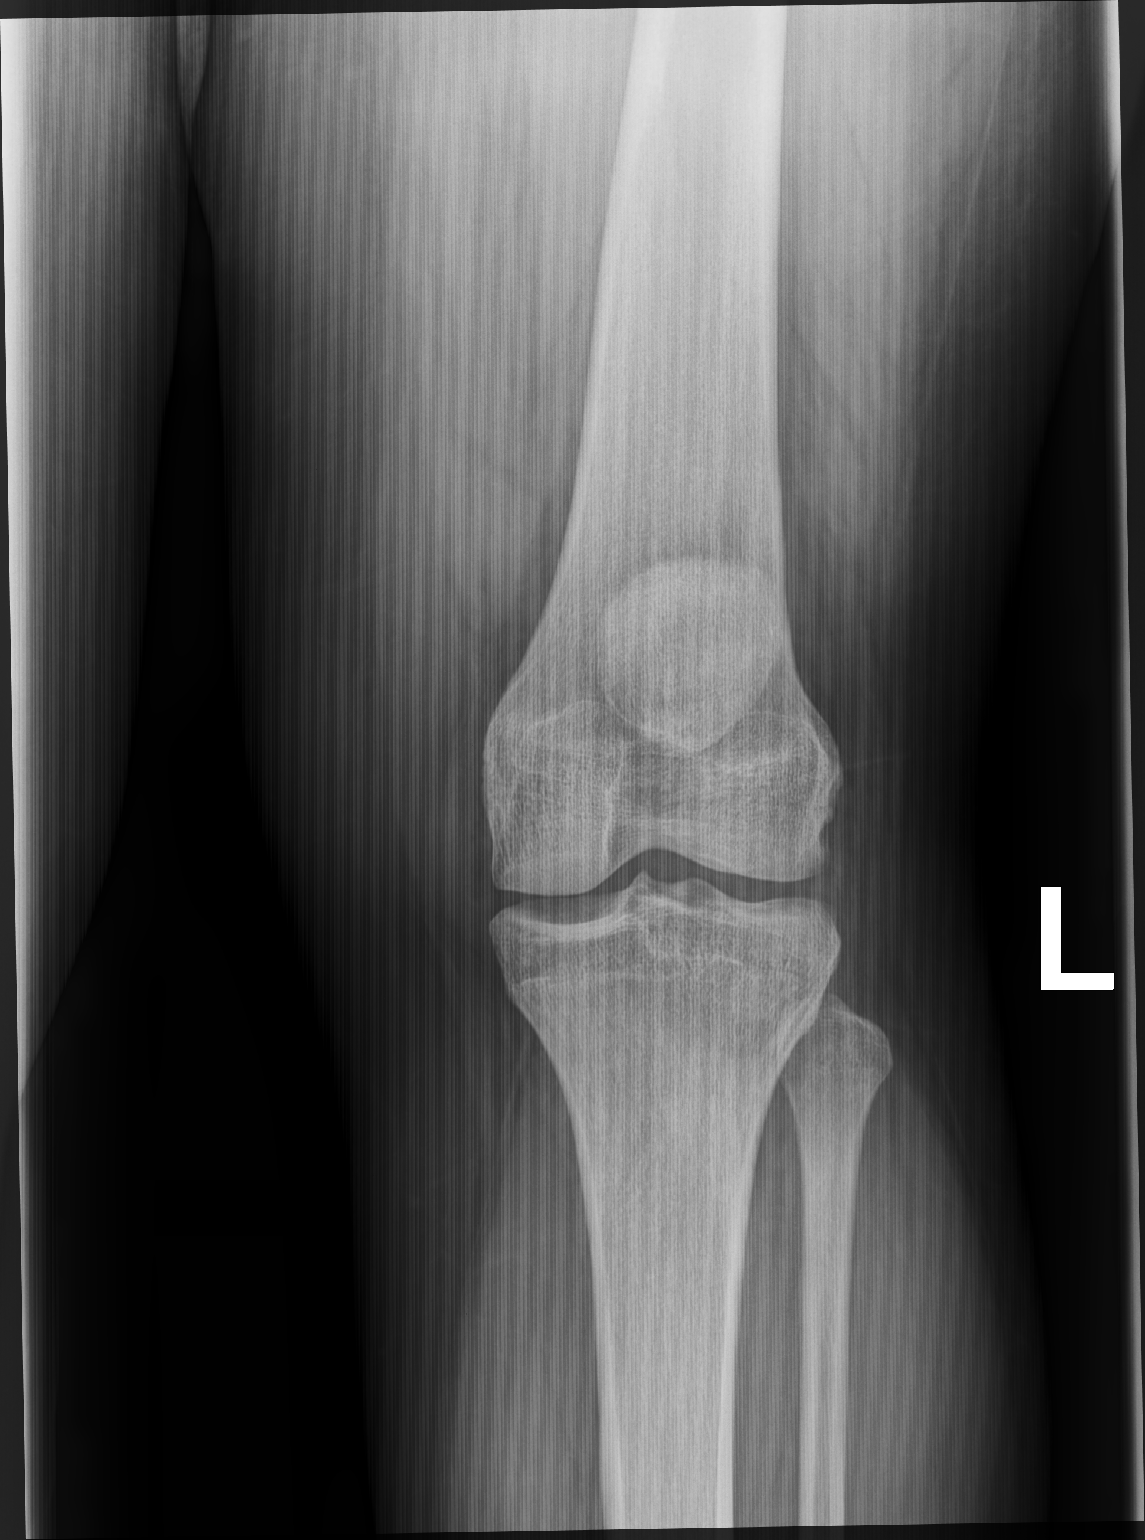

[knee lat]
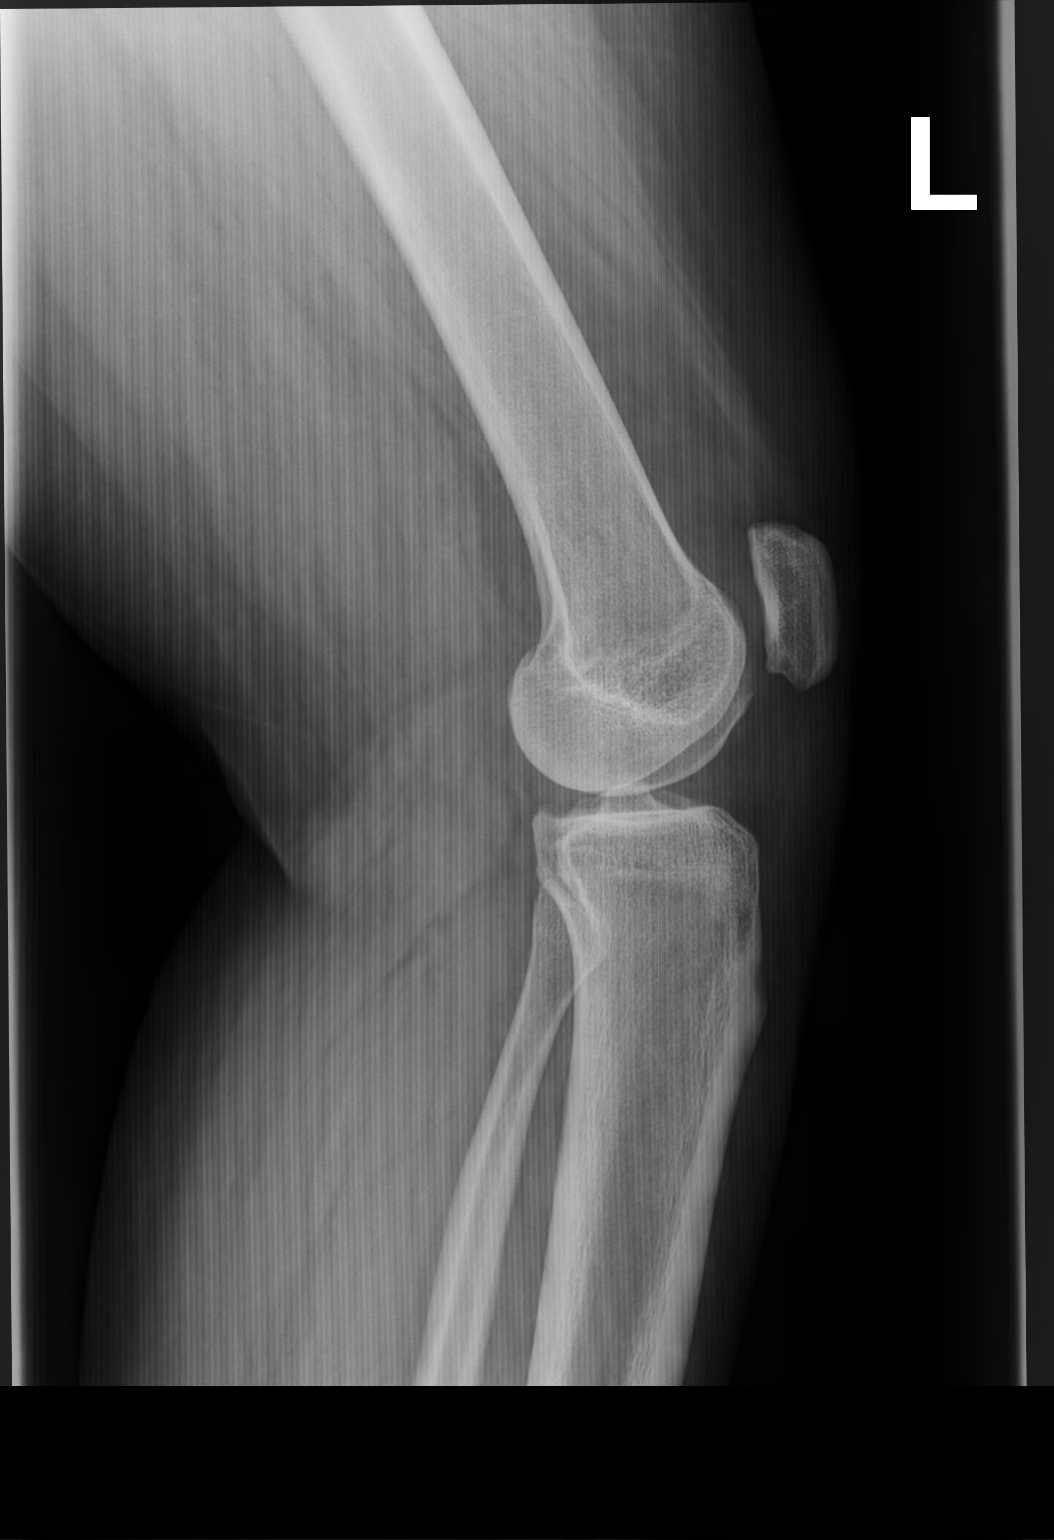

[2 of 2 positions shown; findings below may reference images not displayed]

FINDINGS: No evidence of fracture, dislocation, or joint effusion. No evidence
of arthropathy or other focal bone abnormality. Soft tissues are
unremarkable.
IMPRESSION: Negative.

## 2023-01-19 ENCOUNTER — Encounter: Payer: Self-pay | Admitting: Emergency Medicine

## 2023-01-19 ENCOUNTER — Other Ambulatory Visit: Payer: Self-pay

## 2023-01-19 ENCOUNTER — Ambulatory Visit
Admission: EM | Admit: 2023-01-19 | Discharge: 2023-01-19 | Disposition: A | Discharge status: Home / Self Care | Payer: Managed Care, Other (non HMO)

## 2023-01-19 DIAGNOSIS — J329 Chronic sinusitis, unspecified: Secondary | ICD-10-CM

## 2023-01-19 DIAGNOSIS — J40 Bronchitis, not specified as acute or chronic: Secondary | ICD-10-CM | POA: Diagnosis not present

## 2023-01-19 MED ORDER — AMOXICILLIN-POT CLAVULANATE 875-125 MG PO TABS: 1.0000 | ORAL_TABLET | Freq: Two times a day (BID) | ORAL | 0 refills | Status: DC

## 2023-01-19 MED ORDER — PREDNISONE 20 MG PO TABS: 40.0000 mg | ORAL_TABLET | Freq: Every day | ORAL | 0 refills | Status: AC

## 2023-01-19 NOTE — ED Triage Notes (Signed)
Pt reports productive cough (yellow sputum) and runny nose x2 weeks. No relief with OTC meds. Worsening cough is causing headaches and pain to ribcage area.

## 2023-01-24 NOTE — ED Provider Notes (Signed)
[facility]    CSN: [id number] Arrival date & time: 01/19/23  1655      History   Chief Complaint Chief Complaint  Patient presents with   Cough   Nasal Congestion    HPI Sylvia Daniel is a 22 y.o. female.   Patient here today for evaluation of productive cough that she has had the last 2 weeks.  She has had runny nose as well.  She denies any improvement with over-the-counter medication.  She has had some headaches due to cough.  She does not report any vomiting or diarrhea.  The history is provided by the patient.  Cough Associated symptoms: no chills, no ear pain, no eye discharge, no fever, no shortness of breath, no sore throat and no wheezing     History reviewed. No pertinent past medical history.  There are no problems to display for this patient.   Past Surgical History:  Procedure Laterality Date   NO PAST SURGERIES      OB History     Gravida  0   Para  0   Term  0   Preterm  0   AB  0   Living  0      SAB  0   IAB  0   Ectopic  0   Multiple  0   Live Births  0            Home Medications    Prior to Admission medications   Medication Sig Start Date End Date Taking? Authorizing Provider  amoxicillin-clavulanate (AUGMENTIN) 875-125 MG tablet Take 1 tablet by mouth every 12 (twelve) hours. 01/19/23  Yes Tomi Bamberger, PA-C  predniSONE (DELTASONE) 20 MG tablet Take 2 tablets (40 mg total) by mouth daily with breakfast for 5 days. 01/19/23 01/24/23 Yes Tomi Bamberger, PA-C  Vitamin D, Ergocalciferol, (DRISDOL) 1.25 MG (50000 UNIT) CAPS capsule Take 50,000 Units by mouth once a week. 11/30/22  Yes [provider]  Adapalene 0.3 % gel 1 application to affected area Externally Once a day for 30 days Patient not taking: Reported on 01/19/2023 04/28/15   [provider]  clindamycin (CLINDAGEL) 1 % gel 1 application to affected area Externally twice a day for 30 days Patient not taking: Reported on  01/19/2023 04/28/15   [provider]  naproxen (NAPROSYN) 500 MG tablet Take 1 tablet (500 mg total) by mouth 2 (two) times daily. Patient not taking: Reported on 01/19/2023 08/11/21   Raspet, Noberto Retort, PA-C    Family History Family History  Problem Relation Age of Onset   Healthy Mother    Healthy Father    Diabetes Other    Hypertension Other     Social History Social History   Tobacco Use   Smoking status: Never   Smokeless tobacco: Never  Vaping Use   Vaping status: Never Used  Substance Use Topics   Alcohol use: Yes    Comment: occasional   Drug use: No     Allergies   Patient has no active allergies.   Review of Systems Review of Systems  Constitutional:  Negative for chills and fever.  HENT:  Positive for congestion. Negative for ear pain and sore throat.   Eyes:  Negative for discharge and redness.  Respiratory:  Positive for cough. Negative for shortness of breath and wheezing.   Gastrointestinal:  Negative for abdominal pain, diarrhea, nausea and vomiting.     Physical Exam Triage Vital Signs ED Triage  Vitals [01/19/23 1725]  Encounter Vitals Group     BP 125/81     Systolic BP Percentile      Diastolic BP Percentile      Pulse Rate 89     Resp 16     Temp 98.3 F (36.8 C)     Temp Source Oral     SpO2 99 %     Weight      Height      Head Circumference      Peak Flow      Pain Score      Pain Loc      Pain Education      Exclude from Growth Chart    No data found.  Updated Vital Signs BP 125/81 (BP Location: Left Arm)   Pulse 89   Temp 98.3 F (36.8 C) (Oral)   Resp 16   LMP 01/03/2023 (Exact Date)   SpO2 99%   Visual Acuity Right Eye Distance:   Left Eye Distance:   Bilateral Distance:    Right Eye Near:   Left Eye Near:    Bilateral Near:     Physical Exam Vitals and nursing note reviewed.  Constitutional:      General: She is not in acute distress.    Appearance: Normal appearance. She is not ill-appearing.   HENT:     Head: Normocephalic and atraumatic.     Nose: Congestion present.     Mouth/Throat:     Mouth: Mucous membranes are moist.     Pharynx: No oropharyngeal exudate or posterior oropharyngeal erythema.  Eyes:     Conjunctiva/sclera: Conjunctivae normal.  Cardiovascular:     Rate and Rhythm: Normal rate and regular rhythm.     Heart sounds: Normal heart sounds. No murmur heard. Pulmonary:     Effort: Pulmonary effort is normal. No respiratory distress.     Breath sounds: Normal breath sounds. No wheezing, rhonchi or rales.  Skin:    General: Skin is warm and dry.  Neurological:     Mental Status: She is alert.  Psychiatric:        Mood and Affect: Mood normal.        Thought Content: Thought content normal.      UC Treatments / Results  Labs (all labs ordered are listed, but only abnormal results are displayed) Labs Reviewed - No data to display  EKG   Radiology No results found.  Procedures Procedures (including critical care time)  Medications Ordered in UC Medications - No data to display  Initial Impression / Assessment and Plan / UC Course  I have reviewed the triage vital signs and the nursing notes.  Pertinent labs & imaging results that were available during my care of the patient were reviewed by me and considered in my medical decision making (see chart for details).    Will treat to cover sinobronchitis with Augmentin and steroid burst.  Recommended follow-up if no gradual improvement with any further concerns.  Patient expressed understanding.  Final Clinical Impressions(s) / UC Diagnoses   Final diagnoses:  Sinobronchitis   Discharge Instructions   None    ED Prescriptions     Medication Sig Dispense Auth. Provider   amoxicillin-clavulanate (AUGMENTIN) 875-125 MG tablet Take 1 tablet by mouth every 12 (twelve) hours. 14 tablet Erma Pinto F, PA-C   predniSONE (DELTASONE) 20 MG tablet Take 2 tablets (40 mg total) by mouth daily with  breakfast for 5 days. 10 tablet Erma Pinto  F, PA-C      PDMP not reviewed this encounter.   Tomi Bamberger, PA-C 01/24/23 [phone]

## 2023-01-26 ENCOUNTER — Other Ambulatory Visit: Payer: Self-pay

## 2023-01-26 ENCOUNTER — Emergency Department (HOSPITAL_BASED_OUTPATIENT_CLINIC_OR_DEPARTMENT_OTHER)
Admission: EM | Admit: 2023-01-26 | Discharge: 2023-01-26 | Disposition: A | Discharge status: Home / Self Care | Payer: Managed Care, Other (non HMO) | Attending: Emergency Medicine | Admitting: Emergency Medicine

## 2023-01-26 DIAGNOSIS — S161XXA Strain of muscle, fascia and tendon at neck level, initial encounter: Secondary | ICD-10-CM | POA: Insufficient documentation

## 2023-01-26 DIAGNOSIS — Y92410 Unspecified street and highway as the place of occurrence of the external cause: Secondary | ICD-10-CM | POA: Insufficient documentation

## 2023-01-26 DIAGNOSIS — S39012A Strain of muscle, fascia and tendon of lower back, initial encounter: Secondary | ICD-10-CM | POA: Diagnosis not present

## 2023-01-26 DIAGNOSIS — S199XXA Unspecified injury of neck, initial encounter: Secondary | ICD-10-CM | POA: Diagnosis present

## 2023-01-26 MED ORDER — METHOCARBAMOL 500 MG PO TABS: 500.0000 mg | ORAL_TABLET | Freq: Two times a day (BID) | ORAL | 0 refills | Status: AC

## 2023-01-26 MED ORDER — NAPROXEN 500 MG PO TABS: 500.0000 mg | ORAL_TABLET | Freq: Two times a day (BID) | ORAL | 0 refills | Status: DC

## 2023-01-26 MED ORDER — KETOROLAC TROMETHAMINE 30 MG/ML IJ SOLN
30.0000 mg | Freq: Once | INTRAMUSCULAR | Status: DC
  Filled 2023-01-26: qty 1

## 2023-01-26 MED ORDER — KETOROLAC TROMETHAMINE 30 MG/ML IJ SOLN
30.0000 mg | Freq: Once | INTRAMUSCULAR | Status: AC
Start: 2023-01-26 — End: 2023-01-26
  Administered 2023-01-26: 30 mg via INTRAMUSCULAR

## 2023-01-26 MED ORDER — IBUPROFEN 400 MG PO TABS
400.0000 mg | ORAL_TABLET | Freq: Once | ORAL | Status: AC | PRN
  Administered 2023-01-26: 400 mg via ORAL
  Filled 2023-01-26: qty 1

## 2023-01-26 MED ORDER — METHOCARBAMOL 500 MG PO TABS
500.0000 mg | ORAL_TABLET | Freq: Once | ORAL | Status: AC
  Administered 2023-01-26: 500 mg via ORAL
  Filled 2023-01-26: qty 1
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
## 2023-01-26 NOTE — ED Notes (Signed)
 RN reviewed discharge instructions with pt. Pt verbalized understanding and had no further questions. VSS upon discharge.  

## 2023-01-26 NOTE — ED Notes (Signed)
Pt aware of need for urine sample, unable to provide at this time. Pt provided some fluids to drink.

## 2023-02-09 ENCOUNTER — Other Ambulatory Visit (HOSPITAL_COMMUNITY)
Admission: RE | Admit: 2023-02-09 | Discharge: 2023-02-09 | Disposition: A | Discharge status: Home / Self Care | Payer: Managed Care, Other (non HMO) | Source: Ambulatory Visit | Attending: Obstetrics & Gynecology | Admitting: Obstetrics & Gynecology

## 2023-02-09 ENCOUNTER — Ambulatory Visit (INDEPENDENT_AMBULATORY_CARE_PROVIDER_SITE_OTHER): Payer: Managed Care, Other (non HMO) | Admitting: Obstetrics & Gynecology

## 2023-02-09 ENCOUNTER — Encounter: Payer: Self-pay | Admitting: Obstetrics & Gynecology

## 2023-02-09 VITALS — BP 109/61 | HR 69 | Ht 65.0 in | Wt 260.0 lb

## 2023-02-09 DIAGNOSIS — Z1151 Encounter for screening for human papillomavirus (HPV): Secondary | ICD-10-CM | POA: Diagnosis not present

## 2023-02-09 DIAGNOSIS — Z113 Encounter for screening for infections with a predominantly sexual mode of transmission: Secondary | ICD-10-CM

## 2023-02-09 DIAGNOSIS — Z01419 Encounter for gynecological examination (general) (routine) without abnormal findings: Secondary | ICD-10-CM | POA: Diagnosis not present

## 2023-02-09 NOTE — Progress Notes (Signed)
Subjective:     Sylvia Daniel is a 22 y.o. female here for a routine exam.  Current complaints: No complaints. Pt is sexually active with no contraception. Desires pregnancy. New partner since last visit. Pt recently got a house! Renting at present.       Gynecologic History Patient's last menstrual period was 02/01/2023 (exact date). Contraception: none Last Pap: 07/11/2019. Results were: normal Last mammogram: n/a  Obstetric History OB History  Gravida Para Term Preterm AB Living  0 0 0 0 0 0  SAB IAB Ectopic Multiple Live Births  0 0 0 0 0   The following portions of the patient's history were reviewed and updated as appropriate: allergies, current medications, past family history, past medical history, past social history, past surgical history, and problem list.  Review of Systems Pertinent items are noted in HPI.    Objective:  BP 109/61 (BP Location: Left Arm, Patient Position: Sitting, Cuff Size: Large)   Pulse 69   Ht 5\' 5"  (1.651 m)   Wt 260 lb (117.9 kg)   LMP 02/01/2023 (Exact Date)   BMI 43.27 kg/m    General Appearance:    Alert, cooperative, no distress, appears stated age  Head:    Normocephalic, without obvious abnormality, atraumatic  Eyes:    conjunctiva/corneas clear, EOM's intact, both eyes  Ears:    Normal external ear canals, both ears  Nose:   Nares normal, septum midline, mucosa normal, no drainage    or sinus tenderness  Throat:   Lips, mucosa, and tongue normal; teeth and gums normal  Neck:   Supple, symmetrical, trachea midline, no adenopathy;    thyroid:  no enlargement/tenderness/nodules  Back:     Symmetric, no curvature, ROM normal, no CVA tenderness  Lungs:     respirations unlabored  Chest Wall:    No tenderness or deformity   Heart:    Regular rate and rhythm  Breast Exam:    No tenderness, masses, or nipple abnormality  Abdomen:     Soft, non-tender, bowel sounds active all four quadrants,    no masses, no organomegaly; umbilical ring   Genitalia:    Normal female without lesion, discharge or tenderness     Extremities:   Extremities normal, atraumatic, no cyanosis or edema  Pulses:   2+ and symmetric all extremities  Skin:   Skin color, texture, turgor normal, no rashes or lesions     Assessment:    Healthy female exam.  STI screening Desires pregnancy. On PNVs currently.    Plan:   Sylvia Daniel was seen today for annual exam.  Diagnoses and all orders for this visit:  Well female exam with routine gynecological exam -     Cytology - PAP( )  Routine screening for STI (sexually transmitted infection)   F/u PAP F/u STI screen  Keep PNV  F/u in 1 year or sooner prn   Sylvia Daniel, M.D., [place] [place]

## 2023-02-11 LAB — CYTOLOGY - PAP: Diagnosis: NEGATIVE

## 2023-06-30 ENCOUNTER — Encounter: Payer: Self-pay | Admitting: Emergency Medicine

## 2023-06-30 ENCOUNTER — Ambulatory Visit
Admission: EM | Admit: 2023-06-30 | Discharge: 2023-06-30 | Disposition: A | Discharge status: Home / Self Care | Attending: Family Medicine | Admitting: Family Medicine

## 2023-06-30 ENCOUNTER — Other Ambulatory Visit: Payer: Self-pay

## 2023-06-30 DIAGNOSIS — R0602 Shortness of breath: Secondary | ICD-10-CM

## 2023-06-30 DIAGNOSIS — R079 Chest pain, unspecified: Secondary | ICD-10-CM

## 2023-06-30 NOTE — ED Provider Notes (Signed)
 [facility] CARE CENTER   [id number] 06/30/23 Arrival Time: 1018  ASSESSMENT & PLAN:  1. SOB (shortness of breath)   2. Chest pain, unspecified type    Declines CXR. Declines ED evaluation. Low susp for PE but cannot r/o here. Discussed and she voices understanding. Prefers home observation. Wanted to r/o bronchitis. Lung exam is normal.   Follow-up Information     [place] [facility] at [facility].   Specialty: Emergency Medicine Why: If symptoms worsen in any way. Contact information: [address] [place]  [address] [phone]                 Chest pain precautions given. Reviewed expectations re: course of current medical issues. Questions answered. Outlined signs and symptoms indicating need for more acute intervention. Patient verbalized understanding. After Visit Summary given.   SUBJECTIVE:  History from: patient. Sylvia Daniel is a 23 y.o. female who presents with complaint of feeling SOB with mild CP; first noted 2-3 days ago; very slight cough. Denies fever. The cough and feeling SOB does wake her at night. Denies asthma history. No tx PTA. Denies recreational drug use. Denies OCP use.  Social History   Tobacco Use  Smoking Status Never   Passive exposure: Current  Smokeless Tobacco Never   Social History   Substance and Sexual Activity  Alcohol Use Yes   Comment: occasional     OBJECTIVE:  Vitals:   06/30/23 1041 06/30/23 1042  BP:  112/79  Pulse:  73  Resp:  16  Temp:  98.1 F (36.7 C)  TempSrc:  Oral  SpO2:  98%  Weight: 117.9 kg     General appearance: alert, oriented, no acute distress Eyes: PERRLA; EOMI; conjunctivae normal HENT: normocephalic; atraumatic Neck: supple with FROM Lungs: without labored respirations; speaks full sentences without difficulty; CTAB Heart: regular Chest Wall: without tenderness to palpation Extremities: without edema; without calf swelling or  tenderness; symmetrical without gross deformities Skin: warm and dry; without rash or lesions Neuro: normal gait Psychological: alert and cooperative; normal mood and affect   No Known Allergies  History reviewed. No pertinent past medical history. Social History   Socioeconomic History   Marital status: Single    Spouse name: Not on file   Number of children: Not on file   Years of education: Not on file   Highest education level: Not on file  Occupational History   Not on file  Tobacco Use   Smoking status: Never    Passive exposure: Current   Smokeless tobacco: Never  Vaping Use   Vaping status: Never Used  Substance and Sexual Activity   Alcohol use: Yes    Comment: occasional   Drug use: No   Sexual activity: Yes    Birth control/protection: None  Other Topics Concern   Not on file  Social History Narrative   Not on file   Social Drivers of Health   Financial Resource Strain: Not on file  Food Insecurity: Not on file  Transportation Needs: Not on file  Physical Activity: Not on file  Stress: Not on file  Social Connections: Unknown (07/07/2021)   Received from [facility]   Social Network    Social Network: Not on file  Intimate Partner Violence: Unknown (05/29/2021)   Received from [facility], [facility]   HITS    Physically Hurt: Not on file    Insult or Talk Down To: Not on file    Threaten  Physical Harm: Not on file    Scream or Curse: Not on file   Family History  Problem Relation Age of Onset   Healthy Mother    Healthy Father    Diabetes Other    Hypertension Other    Past Surgical History:  Procedure Laterality Date   NO PAST SURGERIES        Afton Albright, MD 06/30/23 1206
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
## 2023-07-01 ENCOUNTER — Ambulatory Visit

## 2023-12-21 ENCOUNTER — Emergency Department (HOSPITAL_COMMUNITY)

## 2023-12-21 ENCOUNTER — Other Ambulatory Visit: Payer: Self-pay

## 2023-12-21 ENCOUNTER — Encounter (HOSPITAL_COMMUNITY): Payer: Self-pay

## 2023-12-21 ENCOUNTER — Emergency Department (HOSPITAL_COMMUNITY)
Admission: EM | Admit: 2023-12-21 | Discharge: 2023-12-22 | Disposition: A | Discharge status: Home / Self Care | Attending: Emergency Medicine | Admitting: Emergency Medicine

## 2023-12-21 DIAGNOSIS — R072 Precordial pain: Secondary | ICD-10-CM | POA: Insufficient documentation

## 2023-12-21 DIAGNOSIS — R079 Chest pain, unspecified: Secondary | ICD-10-CM | POA: Diagnosis present

## 2023-12-21 LAB — TROPONIN T, HIGH SENSITIVITY: Troponin T High Sensitivity: <15 ng/L (ref 0–19)

## 2023-12-21 LAB — BASIC METABOLIC PANEL WITH GFR
Anion gap: 13 (ref 5–15)
BUN: 6 mg/dL (ref 6–20)
CO2: 23 mmol/L (ref 22–32)
Calcium: 9.3 mg/dL (ref 8.9–10.3)
Chloride: 104 mmol/L (ref 98–111)
Creatinine, Ser: 0.86 mg/dL (ref 0.44–1.00)
GFR, Estimated: >60 mL/min (ref >60)
Glucose, Bld: 76 mg/dL (ref 70–99)
Potassium: 3.6 mmol/L (ref 3.5–5.1)
Sodium: 141 mmol/L (ref 135–145)

## 2023-12-21 LAB — CBC
HCT: 39.3 % (ref 36.0–46.0)
Hemoglobin: 12.2 g/dL (ref 12.0–15.0)
MCH: 25.4 pg — ABNORMAL LOW (ref 26.0–34.0)
MCHC: 31 g/dL (ref 30.0–36.0)
MCV: 81.9 fL (ref 80.0–100.0)
Platelets: 407 K/uL — ABNORMAL HIGH (ref 150–400)
RBC: 4.8 MIL/uL (ref 3.87–5.11)
RDW: 13.5 % (ref 11.5–15.5)
WBC: 4.9 K/uL (ref 4.0–10.5)
nRBC: 0 % (ref 0.0–0.2)

## 2023-12-21 LAB — HCG, SERUM, QUALITATIVE: Preg, Serum: NEGATIVE

## 2023-12-21 MED ORDER — FAMOTIDINE IN NACL 20-0.9 MG/50ML-% IV SOLN
20.0000 mg | Freq: Once | INTRAVENOUS | Status: AC
  Administered 2023-12-21: 20 mg via INTRAVENOUS
  Filled 2023-12-21: qty 50

## 2023-12-21 MED ORDER — KETOROLAC TROMETHAMINE 30 MG/ML IJ SOLN
30.0000 mg | Freq: Once | INTRAMUSCULAR | Status: AC
  Administered 2023-12-21: 30 mg via INTRAVENOUS
  Filled 2023-12-21: qty 1

## 2023-12-21 NOTE — ED Provider Notes (Signed)
 [facility] Emergency Department Provider Note MRN:  [id number]  Arrival date & time: 12/22/23     Chief Complaint   Chest Pain   History of Present Illness   Sylvia Daniel is a 23 y.o. year-old female presents to the ED with chief complaint of chest pain.  States that she feels like she is  being stabbed in the chest.  Onset was around 1pm this afternoon.  States that the pain has been worsening throughout the day.  Denies cough or fever.  Denies ever having experienced similar.  Denies hx of PE/DVT.  Denies OCPs. Denies recent long travel or immobilization.  Denies any leg swelling.  Denies any injury.  Denies burning in throat or acid reflux symptoms.  Denies having taken anything for the symptoms.  History provided by patient.   Review of Systems  Pertinent positive and negative review of systems noted in HPI.    Physical Exam   Vitals:   12/21/23 2336 12/22/23 0000  BP:  (!) 133/54  Pulse:  69  Resp:  20  Temp:    SpO2: 100% 99%    CONSTITUTIONAL:  non toxic-appearing, NAD NEURO:  Alert and oriented x 3, CN 3-12 grossly intact EYES:  eyes equal and reactive ENT/NECK:  Supple, no stridor  CARDIO:  normal rate, regular rhythm, appears well-perfused  PULM:  No respiratory distress, CTAB GI/GU:  non-distended,  MSK/SPINE:  No gross deformities, no edema, moves all extremities, chest pain worsened with chest flexion SKIN:  no rash, atraumatic   *Additional and/or pertinent findings included in MDM below  Diagnostic and Interventional Summary    EKG Interpretation Date/Time:  Wednesday December 21 2023 20:39:46 EDT Ventricular Rate:  70 PR Interval:  143 QRS Duration:  87 QT Interval:  379 QTC Calculation: 409 R Axis:   62  Text Interpretation: duplicate, discard Confirmed by Raford Lenis ([id number]) on 12/22/2023 12:42:30 AM       Labs Reviewed  CBC - Abnormal; Notable for the following components:      Result Value   MCH 25.4 (*)     Platelets 407 (*)    All other components within normal limits  BASIC METABOLIC PANEL WITH GFR  HCG, SERUM, QUALITATIVE  D-DIMER, QUANTITATIVE (NOT AT [facility])  TROPONIN T, HIGH SENSITIVITY  TROPONIN T, HIGH SENSITIVITY    DG Chest 2 View  Final Result      Medications  ketorolac  (TORADOL ) 30 MG/ML injection 30 mg (30 mg Intravenous Given 12/21/23 2248)  famotidine (PEPCID) IVPB 20 mg premix (0 mg Intravenous Stopped 12/21/23 2327)     Procedures  /  Critical Care Procedures  ED Course and Medical Decision Making  I have reviewed the triage vital signs, the nursing notes, and pertinent available records from the EMR.  Social Determinants Affecting Complexity of Care: Patient has no clinically significant social determinants affecting this chief complaint..   ED Course:    Medical Decision Making Patient here with central chest pain that began earlier today.  She states that it is worsened with movement and bending.  She denies associated shortness of breath.  Denies any acid reflux symptoms.  She is low risk for PE and D-dimer is negative.  She is not hypoxic nor tachycardic.  Low risk for ACS due to age, troponin is negative.  No acute ischemic changes on EKG.  No significant electrolyte abnormality.  No leukocytosis or anemia.  Pregnancy test negative.  Chest x-ray shows no active disease.  No  pneumothorax.  No evidence of infection.  Given that the pain is reproducible with movement, I am more suspicious of musculoskeletal etiology, chest wall inflammation, costochondritis, and will treat with NSAIDs and muscle relaxer.  Patient advised to follow-up with PCP.  She was encouraged to return for new or worsening symptoms.  Amount and/or Complexity of Data Reviewed Labs: ordered. Radiology: ordered.  Risk Prescription drug management.         Consultants: No consultations were needed in caring for this patient.   Treatment and Plan: Emergency department workup  does not suggest an emergent condition requiring admission or immediate intervention beyond  what has been performed at this time. The patient is safe for discharge and has  been instructed to return immediately for worsening symptoms, change in  symptoms or any other concerns    Final Clinical Impressions(s) / ED Diagnoses     ICD-10-CM   1. Precordial pain  R07.2       ED Discharge Orders          Ordered    ibuprofen  (ADVIL ) 800 MG tablet  3 times daily        12/22/23 0115    cyclobenzaprine  (FLEXERIL ) 10 MG tablet  3 times daily PRN        12/22/23 0115              Discharge Instructions Discussed with and Provided to Patient:   Discharge Instructions   None      Vicky Charleston, PA-C 12/22/23 0117    Raford Lenis, MD 12/22/23 [phone]

## 2023-12-21 NOTE — ED Notes (Signed)
 Recollect on D dimer sent to lab

## 2023-12-21 NOTE — ED Triage Notes (Signed)
 Pt reports central chest pain that started at 1pm today that radiates to back. Pt denies recent illness or injury. Pt reports pain is worse with moving and coughing. No other symptoms.

## 2023-12-22 LAB — D-DIMER, QUANTITATIVE: D-Dimer, Quant: <0.27 ug{FEU}/mL (ref 0.00–0.50)

## 2023-12-22 MED ORDER — IBUPROFEN 800 MG PO TABS: 800.0000 mg | ORAL_TABLET | Freq: Three times a day (TID) | ORAL | 0 refills | Status: AC

## 2023-12-22 MED ORDER — CYCLOBENZAPRINE HCL 10 MG PO TABS: 10.0000 mg | ORAL_TABLET | Freq: Three times a day (TID) | ORAL | 0 refills | Status: AC | PRN

## 2024-03-26 ENCOUNTER — Ambulatory Visit: Admitting: Obstetrics and Gynecology

## 2024-04-06 ENCOUNTER — Ambulatory Visit
